# Patient Record
Sex: Male | Born: 1965 | Race: White | Hispanic: No | Marital: Single | Smoking: Never smoker
Health system: Southern US, Community
[De-identification: ages and names within clinical notes are randomized; demographics above are authoritative.]

## PROBLEM LIST (undated history)

## (undated) DIAGNOSIS — T7840XA Allergy, unspecified, initial encounter: Secondary | ICD-10-CM

## (undated) DIAGNOSIS — E119 Type 2 diabetes mellitus without complications: Secondary | ICD-10-CM

## (undated) DIAGNOSIS — K219 Gastro-esophageal reflux disease without esophagitis: Secondary | ICD-10-CM

## (undated) DIAGNOSIS — E785 Hyperlipidemia, unspecified: Secondary | ICD-10-CM

## (undated) HISTORY — DX: Allergy, unspecified, initial encounter: T78.40XA

## (undated) HISTORY — DX: Type 2 diabetes mellitus without complications: E11.9

## (undated) HISTORY — DX: Gastro-esophageal reflux disease without esophagitis: K21.9

## (undated) HISTORY — DX: Hyperlipidemia, unspecified: E78.5

## (undated) HISTORY — PX: KNEE SURGERY: SHX244

---

## 2001-12-31 ENCOUNTER — Ambulatory Visit (HOSPITAL_COMMUNITY): Admission: RE | Admit: 2001-12-31 | Discharge: 2001-12-31 | Payer: Self-pay | Admitting: Internal Medicine

## 2003-01-10 ENCOUNTER — Encounter: Admission: RE | Admit: 2003-01-10 | Discharge: 2003-01-10 | Payer: Self-pay | Admitting: Internal Medicine

## 2011-06-15 ENCOUNTER — Ambulatory Visit (INDEPENDENT_AMBULATORY_CARE_PROVIDER_SITE_OTHER): Payer: BC Managed Care – PPO | Admitting: Ophthalmology

## 2011-06-15 DIAGNOSIS — E11319 Type 2 diabetes mellitus with unspecified diabetic retinopathy without macular edema: Secondary | ICD-10-CM

## 2011-06-15 DIAGNOSIS — H43819 Vitreous degeneration, unspecified eye: Secondary | ICD-10-CM

## 2011-06-15 DIAGNOSIS — E1039 Type 1 diabetes mellitus with other diabetic ophthalmic complication: Secondary | ICD-10-CM

## 2011-06-15 DIAGNOSIS — H2510 Age-related nuclear cataract, unspecified eye: Secondary | ICD-10-CM

## 2012-06-18 ENCOUNTER — Ambulatory Visit (INDEPENDENT_AMBULATORY_CARE_PROVIDER_SITE_OTHER): Payer: BC Managed Care – PPO | Admitting: Ophthalmology

## 2012-06-18 DIAGNOSIS — E11319 Type 2 diabetes mellitus with unspecified diabetic retinopathy without macular edema: Secondary | ICD-10-CM

## 2012-06-18 DIAGNOSIS — E1039 Type 1 diabetes mellitus with other diabetic ophthalmic complication: Secondary | ICD-10-CM

## 2012-06-18 DIAGNOSIS — H43819 Vitreous degeneration, unspecified eye: Secondary | ICD-10-CM

## 2012-06-18 DIAGNOSIS — H2510 Age-related nuclear cataract, unspecified eye: Secondary | ICD-10-CM

## 2013-07-10 ENCOUNTER — Ambulatory Visit (INDEPENDENT_AMBULATORY_CARE_PROVIDER_SITE_OTHER): Payer: BC Managed Care – PPO | Admitting: Ophthalmology

## 2013-07-10 DIAGNOSIS — H43819 Vitreous degeneration, unspecified eye: Secondary | ICD-10-CM

## 2013-07-10 DIAGNOSIS — E1139 Type 2 diabetes mellitus with other diabetic ophthalmic complication: Secondary | ICD-10-CM

## 2013-07-10 DIAGNOSIS — H2510 Age-related nuclear cataract, unspecified eye: Secondary | ICD-10-CM

## 2013-07-10 DIAGNOSIS — E11319 Type 2 diabetes mellitus with unspecified diabetic retinopathy without macular edema: Secondary | ICD-10-CM

## 2013-07-10 DIAGNOSIS — E1165 Type 2 diabetes mellitus with hyperglycemia: Secondary | ICD-10-CM

## 2014-07-23 ENCOUNTER — Ambulatory Visit (INDEPENDENT_AMBULATORY_CARE_PROVIDER_SITE_OTHER): Payer: BLUE CROSS/BLUE SHIELD | Admitting: Ophthalmology

## 2014-07-23 DIAGNOSIS — E10319 Type 1 diabetes mellitus with unspecified diabetic retinopathy without macular edema: Secondary | ICD-10-CM

## 2014-07-23 DIAGNOSIS — H2513 Age-related nuclear cataract, bilateral: Secondary | ICD-10-CM | POA: Diagnosis not present

## 2014-07-23 DIAGNOSIS — H43813 Vitreous degeneration, bilateral: Secondary | ICD-10-CM | POA: Diagnosis not present

## 2014-07-23 DIAGNOSIS — E10329 Type 1 diabetes mellitus with mild nonproliferative diabetic retinopathy without macular edema: Secondary | ICD-10-CM | POA: Diagnosis not present

## 2015-07-29 ENCOUNTER — Ambulatory Visit (INDEPENDENT_AMBULATORY_CARE_PROVIDER_SITE_OTHER): Payer: BLUE CROSS/BLUE SHIELD | Admitting: Ophthalmology

## 2015-09-16 ENCOUNTER — Ambulatory Visit (INDEPENDENT_AMBULATORY_CARE_PROVIDER_SITE_OTHER): Payer: BLUE CROSS/BLUE SHIELD | Admitting: Ophthalmology

## 2015-09-16 DIAGNOSIS — E10319 Type 1 diabetes mellitus with unspecified diabetic retinopathy without macular edema: Secondary | ICD-10-CM | POA: Diagnosis not present

## 2015-09-16 DIAGNOSIS — E103293 Type 1 diabetes mellitus with mild nonproliferative diabetic retinopathy without macular edema, bilateral: Secondary | ICD-10-CM

## 2015-09-16 DIAGNOSIS — H2513 Age-related nuclear cataract, bilateral: Secondary | ICD-10-CM | POA: Diagnosis not present

## 2015-09-16 DIAGNOSIS — H43813 Vitreous degeneration, bilateral: Secondary | ICD-10-CM | POA: Diagnosis not present

## 2016-09-14 ENCOUNTER — Ambulatory Visit (INDEPENDENT_AMBULATORY_CARE_PROVIDER_SITE_OTHER): Payer: BLUE CROSS/BLUE SHIELD | Admitting: Ophthalmology

## 2016-10-11 ENCOUNTER — Encounter (INDEPENDENT_AMBULATORY_CARE_PROVIDER_SITE_OTHER): Payer: BLUE CROSS/BLUE SHIELD | Admitting: Ophthalmology

## 2016-10-11 DIAGNOSIS — E103291 Type 1 diabetes mellitus with mild nonproliferative diabetic retinopathy without macular edema, right eye: Secondary | ICD-10-CM

## 2016-10-11 DIAGNOSIS — H43813 Vitreous degeneration, bilateral: Secondary | ICD-10-CM | POA: Diagnosis not present

## 2016-10-11 DIAGNOSIS — E10319 Type 1 diabetes mellitus with unspecified diabetic retinopathy without macular edema: Secondary | ICD-10-CM

## 2017-05-15 ENCOUNTER — Encounter: Payer: Self-pay | Admitting: Gastroenterology

## 2017-05-15 ENCOUNTER — Other Ambulatory Visit: Payer: Self-pay | Admitting: Internal Medicine

## 2017-05-15 DIAGNOSIS — E785 Hyperlipidemia, unspecified: Secondary | ICD-10-CM

## 2017-05-18 ENCOUNTER — Ambulatory Visit (AMBULATORY_SURGERY_CENTER): Payer: Self-pay

## 2017-05-18 ENCOUNTER — Other Ambulatory Visit: Payer: Self-pay

## 2017-05-18 VITALS — Ht 71.0 in | Wt 192.8 lb

## 2017-05-18 DIAGNOSIS — Z1211 Encounter for screening for malignant neoplasm of colon: Secondary | ICD-10-CM

## 2017-05-18 MED ORDER — PEG-KCL-NACL-NASULF-NA ASC-C 140 G PO SOLR: 1.0000 | Freq: Once | ORAL | 0 refills | Status: AC

## 2017-05-18 NOTE — Progress Notes (Signed)
Denies allergies to eggs or soy products. Denies complication of anesthesia or sedation. Denies use of weight loss medication. Denies use of O2.   Emmi instructions declined.  

## 2017-05-23 ENCOUNTER — Ambulatory Visit
Admission: RE | Admit: 2017-05-23 | Discharge: 2017-05-23 | Disposition: A | Discharge status: Home / Self Care | Payer: BLUE CROSS/BLUE SHIELD | Source: Ambulatory Visit | Attending: Internal Medicine | Admitting: Internal Medicine

## 2017-05-23 ENCOUNTER — Encounter: Payer: Self-pay | Admitting: Gastroenterology

## 2017-05-23 DIAGNOSIS — E785 Hyperlipidemia, unspecified: Secondary | ICD-10-CM

## 2017-06-06 ENCOUNTER — Encounter: Payer: Self-pay | Admitting: Gastroenterology

## 2017-06-06 ENCOUNTER — Other Ambulatory Visit: Payer: Self-pay

## 2017-06-06 ENCOUNTER — Ambulatory Visit (AMBULATORY_SURGERY_CENTER): Payer: BLUE CROSS/BLUE SHIELD | Admitting: Gastroenterology

## 2017-06-06 VITALS — BP 104/61 | HR 67 | Temp 98.6°F | Resp 18 | Ht 71.0 in | Wt 192.0 lb

## 2017-06-06 DIAGNOSIS — Z1211 Encounter for screening for malignant neoplasm of colon: Secondary | ICD-10-CM | POA: Diagnosis present

## 2017-06-06 DIAGNOSIS — Z1212 Encounter for screening for malignant neoplasm of rectum: Secondary | ICD-10-CM

## 2017-06-06 MED ORDER — SODIUM CHLORIDE 0.9 % IV SOLN: 500.0000 mL | Freq: Once | INTRAVENOUS | Status: AC

## 2017-06-06 NOTE — Op Note (Signed)
[facility] Patient Name: Frank Barnett Procedure Date: 06/06/2017 9:48 AM MRN: [id number] Endoscopist: Sherilyn Cooter L. Myrtie Neither , MD Age: 52 Referring MD:  Date of Birth: 1965-08-27 Gender: Male Account #: [id number] Procedure:                Colonoscopy Indications:              Screening for colorectal malignant neoplasm, This                            is the patient's first colonoscopy Medicines:                Monitored Anesthesia Care Procedure:                Pre-Anesthesia Assessment:                           - Prior to the procedure, a History and Physical                            was performed, and patient medications and                            allergies were reviewed. The patient's tolerance of                            previous anesthesia was also reviewed. The risks                            and benefits of the procedure and the sedation                            options and risks were discussed with the patient.                            All questions were answered, and informed consent                            was obtained. Prior Anticoagulants: The patient has                            taken no previous anticoagulant or antiplatelet                            agents. ASA Grade Assessment: II - A patient with                            mild systemic disease. After reviewing the risks                            and benefits, the patient was deemed in                            satisfactory condition to undergo the procedure.  After obtaining informed consent, the colonoscope                            was passed under direct vision. Throughout the                            procedure, the patient's blood pressure, pulse, and                            oxygen saturations were monitored continuously. The                            Colonoscope was introduced through the anus and                            advanced to the the  cecum, identified by                            appendiceal orifice and ileocecal valve. The                            colonoscopy was performed without difficulty. The                            patient tolerated the procedure well. The quality                            of the bowel preparation was excellent. The                            ileocecal valve, appendiceal orifice, and rectum                            were photographed. The quality of the bowel                            preparation was evaluated using the BBPS [facility]                            Bowel Preparation Scale) with scores of: Right                            Colon = 3, Transverse Colon = 3 and Left Colon = 3                            (entire mucosa seen well with no residual staining,                            small fragments of stool or opaque liquid). The                            total BBPS score equals 9. Scope In: 10:00:18 AM Scope Out: 10:15:35 AM Scope Withdrawal Time: 0 hours 11  minutes 27 seconds  Total Procedure Duration: 0 hours 15 minutes 17 seconds  Findings:                 A small skin tag was found on perianal exam.                           The entire examined colon appeared normal on direct                            and retroflexion views. Complications:            No immediate complications. Estimated Blood Loss:     Estimated blood loss: none. Impression:               - Perianal skin tags found on perianal exam.                           - The entire examined colon is normal on direct and                            retroflexion views.                           - No specimens collected. Recommendation:           - Patient has a contact number available for                            emergencies. The signs and symptoms of potential                            delayed complications were discussed with the                            patient. Return to normal activities tomorrow.                             Written discharge instructions were provided to the                            patient.                           - Resume previous diet.                           - Continue present medications.                           - Repeat colonoscopy in 10 years for screening                            purposes. Henry L. Myrtie Neither, MD 06/06/2017 10:23:03 AM This report has been signed electronically.

## 2017-06-06 NOTE — Progress Notes (Signed)
Spontaneous respirations throughout. VSS. Resting comfortably. To PACU on room air. Report to  RN. 

## 2017-06-06 NOTE — Progress Notes (Signed)
Pt's states no medical or surgical changes since previsit or office visit. 

## 2017-06-06 NOTE — Patient Instructions (Signed)
Discharge instructions given. Normal exam. Resume previous medications. YOU HAD AN ENDOSCOPIC PROCEDURE TODAY AT THE [facility]:   Refer to the procedure report that was given to you for any specific questions about what was found during the examination.  If the procedure report does not answer your questions, please call your gastroenterologist to clarify.  If you requested that your care partner not be given the details of your procedure findings, then the procedure report has been included in a sealed envelope for you to review at your convenience later.  YOU SHOULD EXPECT: Some feelings of bloating in the abdomen. Passage of more gas than usual.  Walking can help get rid of the air that was put into your GI tract during the procedure and reduce the bloating. If you had a lower endoscopy (such as a colonoscopy or flexible sigmoidoscopy) you may notice spotting of blood in your stool or on the toilet paper. If you underwent a bowel prep for your procedure, you may not have a normal bowel movement for a few days.  Please Note:  You might notice some irritation and congestion in your nose or some drainage.  This is from the oxygen used during your procedure.  There is no need for concern and it should clear up in a day or so.  SYMPTOMS TO REPORT IMMEDIATELY:   Following lower endoscopy (colonoscopy or flexible sigmoidoscopy):  Excessive amounts of blood in the stool  Significant tenderness or worsening of abdominal pains  Swelling of the abdomen that is new, acute  Fever of 100F or higher   For urgent or emergent issues, a gastroenterologist can be reached at any hour by calling [phone].   DIET:  We do recommend a small meal at first, but then you may proceed to your regular diet.  Drink plenty of fluids but you should avoid alcoholic beverages for 24 hours.  ACTIVITY:  You should plan to take it easy for the rest of today and you should NOT DRIVE or use heavy machinery  until tomorrow (because of the sedation medicines used during the test).    FOLLOW UP: Our staff will call the number listed on your records the next business day following your procedure to check on you and address any questions or concerns that you may have regarding the information given to you following your procedure. If we do not reach you, we will leave a message.  However, if you are feeling well and you are not experiencing any problems, there is no need to return our call.  We will assume that you have returned to your regular daily activities without incident.  If any biopsies were taken you will be contacted by phone or by letter within the next 1-3 weeks.  Please call us at [phone] if you have not heard about the biopsies in 3 weeks.    SIGNATURES/CONFIDENTIALITY: You and/or your care partner have signed paperwork which will be entered into your electronic medical record.  These signatures attest to the fact that that the information above on your After Visit Summary has been reviewed and is understood.  Full responsibility of the confidentiality of this discharge information lies with you and/or your care-partner. 

## 2017-06-07 ENCOUNTER — Telehealth: Payer: Self-pay | Admitting: *Deleted

## 2017-06-07 NOTE — Telephone Encounter (Signed)
  Follow up Call-  Call back number 06/06/2017  Post procedure Call Back phone  # [phone]  Permission to leave phone message Yes  Some recent data might be hidden     Patient questions:  Do you have a fever, pain , or abdominal swelling? No. Pain Score  0 *  Have you tolerated food without any problems? Yes.    Have you been able to return to your normal activities? Yes.    Do you have any questions about your discharge instructions: Diet   No. Medications  No. Follow up visit  No.  Do you have questions or concerns about your Care? No.  Actions: * If pain score is 4 or above: No action needed, pain <4.

## 2017-06-07 NOTE — Telephone Encounter (Signed)
Unable to reach pt. By home or cell numbers.

## 2017-10-12 ENCOUNTER — Encounter (INDEPENDENT_AMBULATORY_CARE_PROVIDER_SITE_OTHER): Payer: BLUE CROSS/BLUE SHIELD | Admitting: Ophthalmology

## 2017-10-12 DIAGNOSIS — H43813 Vitreous degeneration, bilateral: Secondary | ICD-10-CM | POA: Diagnosis not present

## 2017-10-12 DIAGNOSIS — E103293 Type 1 diabetes mellitus with mild nonproliferative diabetic retinopathy without macular edema, bilateral: Secondary | ICD-10-CM

## 2017-10-12 DIAGNOSIS — H2513 Age-related nuclear cataract, bilateral: Secondary | ICD-10-CM | POA: Diagnosis not present

## 2017-10-12 DIAGNOSIS — E10319 Type 1 diabetes mellitus with unspecified diabetic retinopathy without macular edema: Secondary | ICD-10-CM

## 2018-10-18 ENCOUNTER — Other Ambulatory Visit: Payer: Self-pay

## 2018-10-18 ENCOUNTER — Encounter (INDEPENDENT_AMBULATORY_CARE_PROVIDER_SITE_OTHER): Payer: BC Managed Care – PPO | Admitting: Ophthalmology

## 2018-10-18 DIAGNOSIS — H2513 Age-related nuclear cataract, bilateral: Secondary | ICD-10-CM | POA: Diagnosis not present

## 2018-10-18 DIAGNOSIS — H43813 Vitreous degeneration, bilateral: Secondary | ICD-10-CM | POA: Diagnosis not present

## 2018-10-18 DIAGNOSIS — E10319 Type 1 diabetes mellitus with unspecified diabetic retinopathy without macular edema: Secondary | ICD-10-CM | POA: Diagnosis not present

## 2018-10-18 DIAGNOSIS — E103293 Type 1 diabetes mellitus with mild nonproliferative diabetic retinopathy without macular edema, bilateral: Secondary | ICD-10-CM | POA: Diagnosis not present

## 2019-04-19 IMAGING — CT CT HEART SCORING
3 series · 14 of 20 positions shown, 16 images · non-contrast
Comparison: None.

CLINICAL DATA: 51-year-old white male with hyperlipidemia.

EXAM:
CT HEART FOR CALCIUM SCORING
TECHNIQUE: CT heart was performed on a 64 channel system using prospective ECG
gating.
A non-contrast exam for calcium scoring was performed.
Note that this exam targets the heart and the chest was not imaged
in its entirety.

[Series 2: calcium scoring 2.00 qr36 bestdiast 70% · axial · 0.55mm/px · z∈[+1391,+1461]mm · 4 of 59 slices shown]
[im 12/59  vessel]
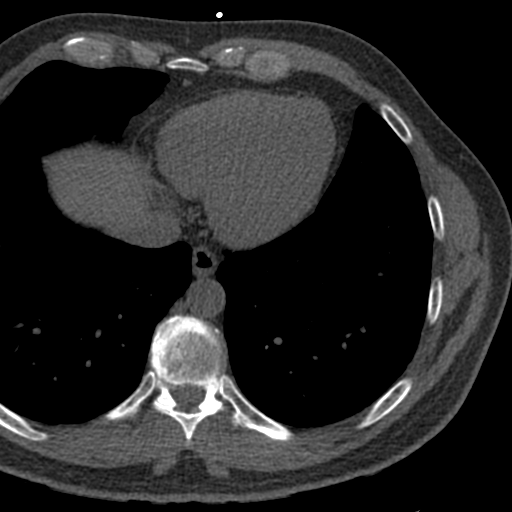
[im 24/59  vessel]
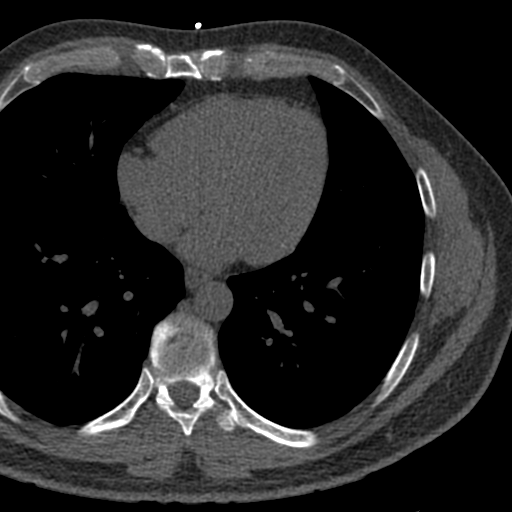
[im 35/59  vessel]
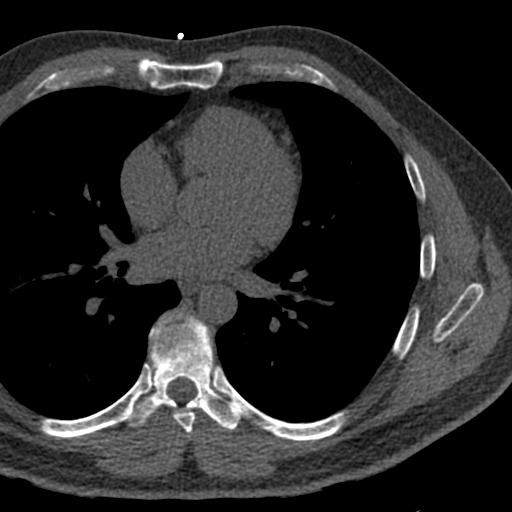
[im 47/59  vessel]
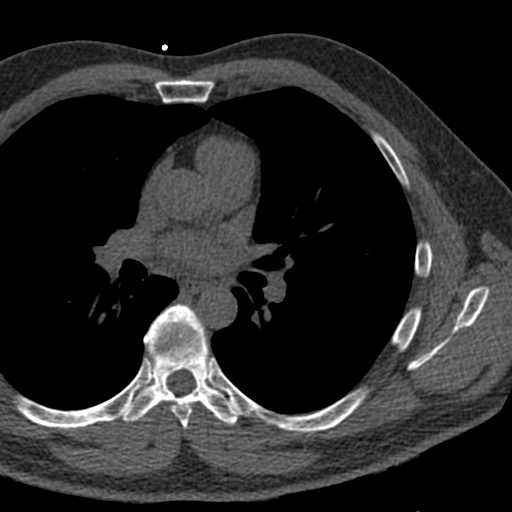

[Series 3: calcium scoring 2.00 br40 bestdiast 70% ax fov · axial · 0.53mm/px · z∈[+1386,+1466]mm · 5 of 60 slices shown, 7 images]
[im 10/60  vessel]
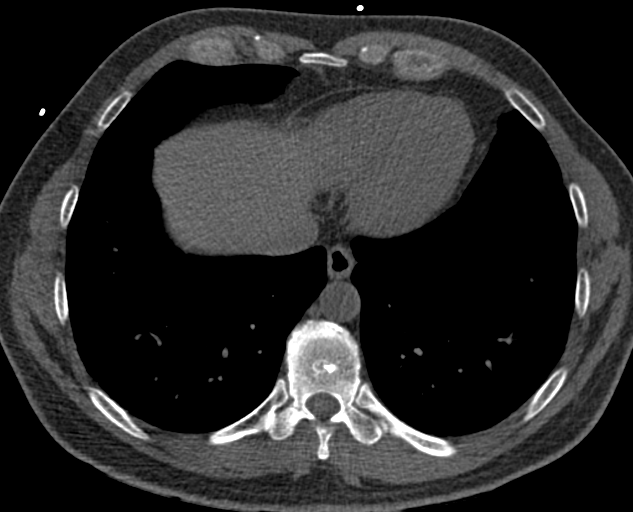
[im 10/60  lung]
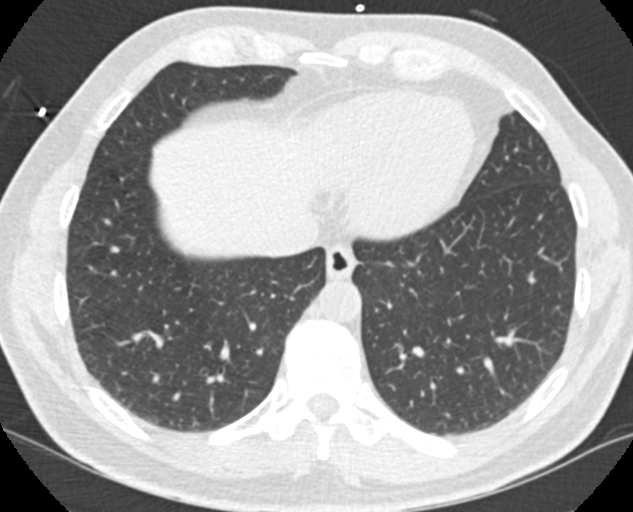
[im 20/60  vessel]
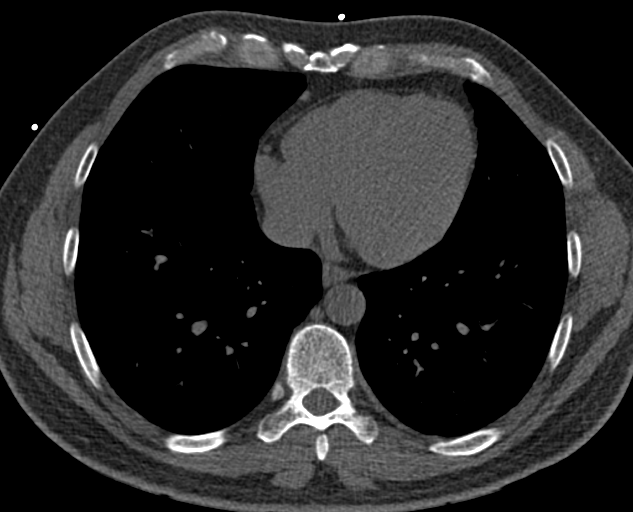
[im 30/60  vessel]
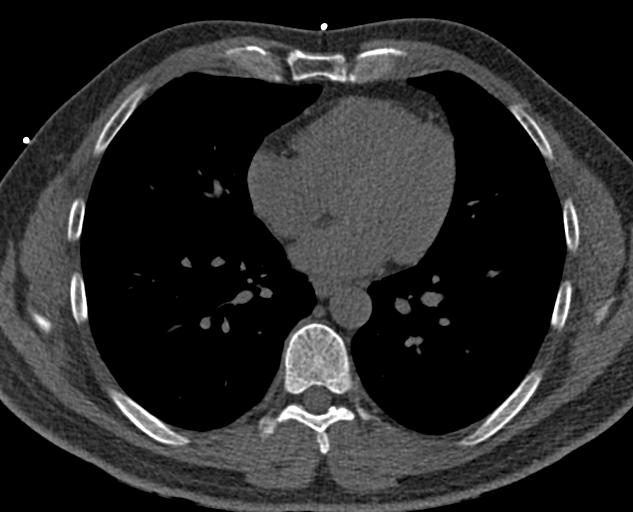
[im 40/60  vessel]
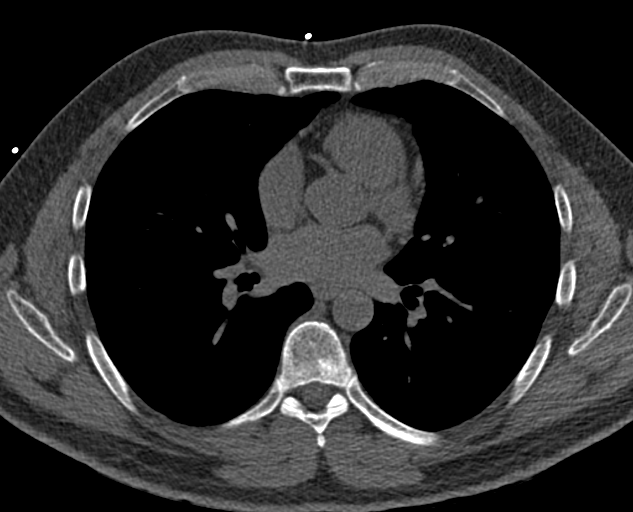
[im 50/60  vessel]
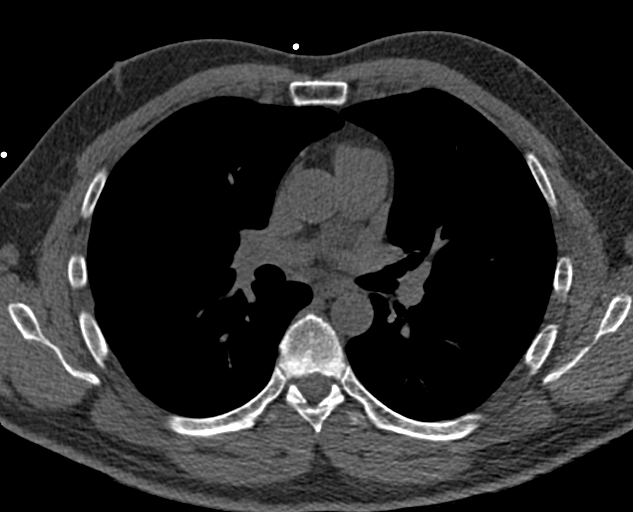
[im 50/60  lung]
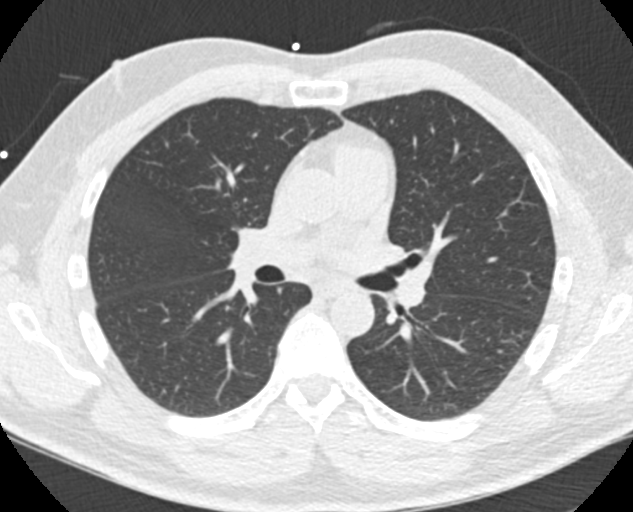

[Series 9: calcium scoring 2.00 br60 bestdiast 70% ax fov · axial · 0.53mm/px · z∈[+1386,+1466]mm · 5 of 60 slices shown]
[im 10/60  vessel]
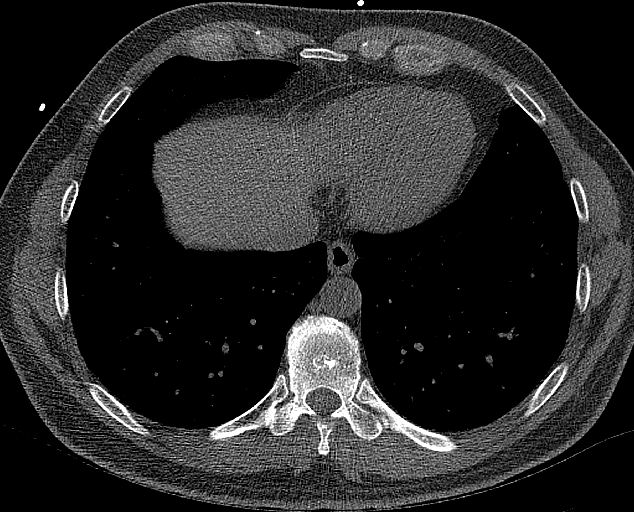
[im 20/60  vessel]
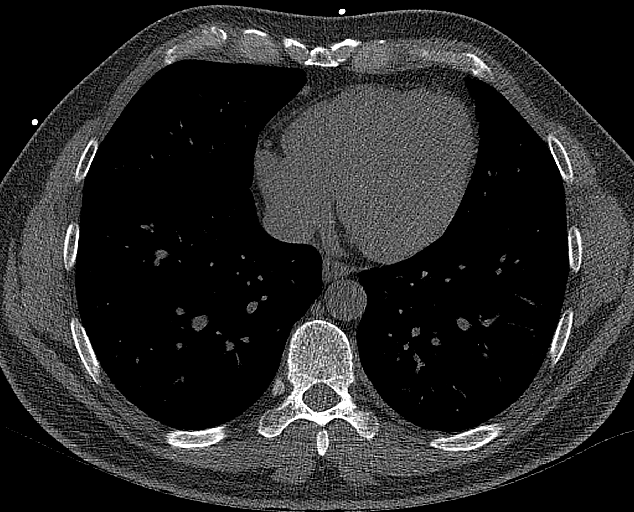
[im 30/60  vessel]
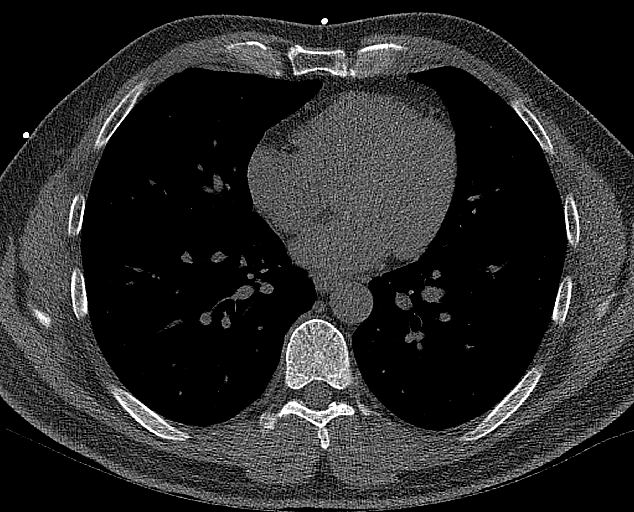
[im 40/60  vessel]
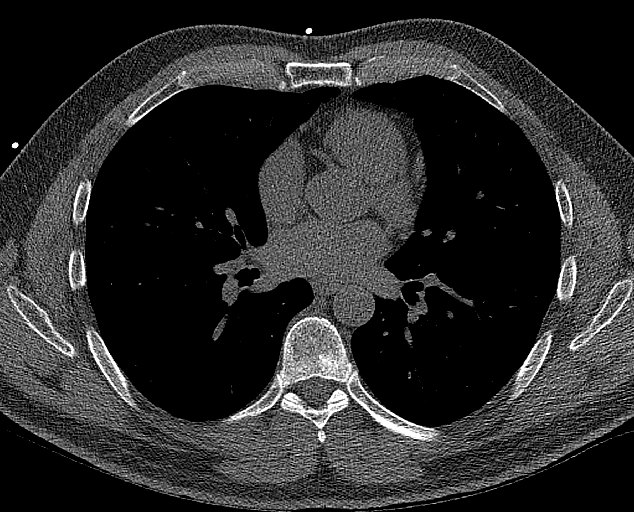
[im 50/60  vessel]
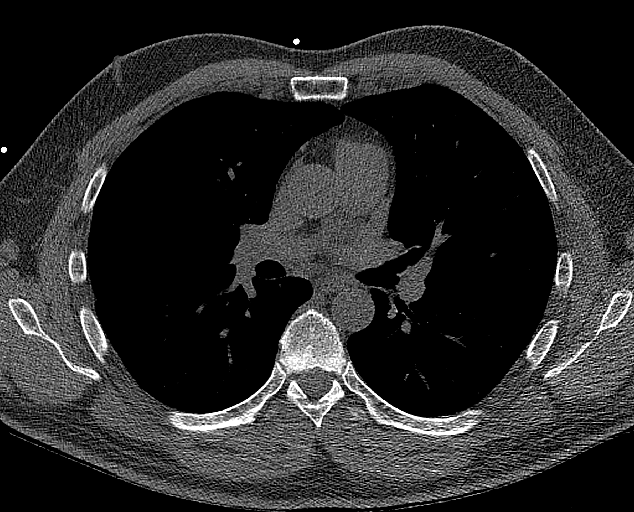

[14 of 20 positions shown; findings below may reference images not displayed]

FINDINGS: Technical quality: Good.

CORONARY CALCIUM

Total Agatston Score:

[HOSPITAL] percentile:  72

OTHER FINDINGS:

Cardiovascular: There is coronary calcium in the LAD. Heart size is
normal without significant pericardial fluid. Normal caliber of the
visualized pulmonary arteries.

Mediastinum/Nodes: Mediastinal structures are unremarkable.

Lungs/Pleura: No large pleural effusions. Visualized lungs are
clear.

Upper Abdomen: Visualized upper abdominal structures are
unremarkable.

Musculoskeletal: Unremarkable.
IMPRESSION: Coronary calcium score is 21.9. This is 72 percentile for patients
of the same age, sex and ethnicity.

## 2019-10-21 ENCOUNTER — Other Ambulatory Visit: Payer: Self-pay

## 2019-10-21 ENCOUNTER — Encounter (INDEPENDENT_AMBULATORY_CARE_PROVIDER_SITE_OTHER): Payer: BC Managed Care – PPO | Admitting: Ophthalmology

## 2019-10-21 DIAGNOSIS — H43813 Vitreous degeneration, bilateral: Secondary | ICD-10-CM | POA: Diagnosis not present

## 2019-10-21 DIAGNOSIS — E10319 Type 1 diabetes mellitus with unspecified diabetic retinopathy without macular edema: Secondary | ICD-10-CM

## 2019-10-21 DIAGNOSIS — E103293 Type 1 diabetes mellitus with mild nonproliferative diabetic retinopathy without macular edema, bilateral: Secondary | ICD-10-CM

## 2020-03-06 ENCOUNTER — Other Ambulatory Visit: Payer: BLUE CROSS/BLUE SHIELD

## 2020-10-21 ENCOUNTER — Other Ambulatory Visit: Payer: Self-pay

## 2020-10-21 ENCOUNTER — Encounter (INDEPENDENT_AMBULATORY_CARE_PROVIDER_SITE_OTHER): Payer: BC Managed Care – PPO | Admitting: Ophthalmology

## 2020-10-21 DIAGNOSIS — E103293 Type 1 diabetes mellitus with mild nonproliferative diabetic retinopathy without macular edema, bilateral: Secondary | ICD-10-CM | POA: Diagnosis not present

## 2020-10-21 DIAGNOSIS — H43813 Vitreous degeneration, bilateral: Secondary | ICD-10-CM

## 2020-11-18 ENCOUNTER — Other Ambulatory Visit (HOSPITAL_COMMUNITY): Payer: Self-pay

## 2020-11-18 MED ORDER — INFLUENZA VAC SPLIT QUAD 0.5 ML IM SUSY
PREFILLED_SYRINGE | INTRAMUSCULAR | 0 refills | Status: AC
  Filled 2020-11-18: qty 0.5, 1d supply, fill #0

## 2021-10-22 ENCOUNTER — Encounter (INDEPENDENT_AMBULATORY_CARE_PROVIDER_SITE_OTHER): Payer: BC Managed Care – PPO | Admitting: Ophthalmology

## 2021-10-22 DIAGNOSIS — E103291 Type 1 diabetes mellitus with mild nonproliferative diabetic retinopathy without macular edema, right eye: Secondary | ICD-10-CM

## 2021-10-22 DIAGNOSIS — E103392 Type 1 diabetes mellitus with moderate nonproliferative diabetic retinopathy without macular edema, left eye: Secondary | ICD-10-CM | POA: Diagnosis not present

## 2021-10-22 DIAGNOSIS — H43813 Vitreous degeneration, bilateral: Secondary | ICD-10-CM

## 2021-11-16 ENCOUNTER — Other Ambulatory Visit (HOSPITAL_BASED_OUTPATIENT_CLINIC_OR_DEPARTMENT_OTHER): Payer: Self-pay

## 2021-11-16 MED ORDER — FLUARIX QUADRIVALENT 0.5 ML IM SUSY
PREFILLED_SYRINGE | INTRAMUSCULAR | 0 refills | Status: AC
  Filled 2021-11-16: qty 0.5, 1d supply, fill #0

## 2022-10-14 ENCOUNTER — Encounter (INDEPENDENT_AMBULATORY_CARE_PROVIDER_SITE_OTHER): Payer: Self-pay | Admitting: Ophthalmology
